# Patient Record
Sex: Male | Born: 1995 | Race: White | Hispanic: No | Marital: Single | Smoking: Never smoker
Health system: Southern US, Community
[De-identification: ages and names within clinical notes are randomized; demographics above are authoritative.]

---
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
## 2016-01-06 ENCOUNTER — Ambulatory Visit (INDEPENDENT_AMBULATORY_CARE_PROVIDER_SITE_OTHER): Payer: BLUE CROSS/BLUE SHIELD

## 2016-01-06 ENCOUNTER — Ambulatory Visit (INDEPENDENT_AMBULATORY_CARE_PROVIDER_SITE_OTHER): Payer: BLUE CROSS/BLUE SHIELD | Admitting: Family Medicine

## 2016-01-06 ENCOUNTER — Encounter: Payer: Self-pay | Admitting: Family Medicine

## 2016-01-06 VITALS — BP 107/64 | HR 78 | Temp 98.5°F | Ht 69.0 in | Wt 144.0 lb

## 2016-01-06 DIAGNOSIS — M25511 Pain in right shoulder: Secondary | ICD-10-CM

## 2016-01-06 MED ORDER — DICLOFENAC SODIUM 75 MG PO TBEC: 75.0000 mg | DELAYED_RELEASE_TABLET | Freq: Two times a day (BID) | ORAL | 0 refills | Status: AC

## 2016-01-06 NOTE — Progress Notes (Signed)
Subjective:  Patient ID: Dale Bush, male    DOB: [birth year]  Age: 21 y.o. MRN: [id number]  CC: Shoulder Pain (pt here today c/o pain in the right shoulder x 1 month, no recent injuries)   HPI Dale Bush presents for Patient is in college. He does carry a backpack over his right shoulder frequently. It is heavy with all the books that he has to carry. There is no known injury. Pain is moderate and does not prevent activities. However throwing motion for sports has been painful and diminished.   History Dale Bush has no past medical history on file.   He has no past surgical history on file.   His family history is not on file.He reports that he has never smoked. He has never used smokeless tobacco. He reports that he does not drink alcohol or use drugs.    ROS Review of Systems  Constitutional: Negative for chills, diaphoresis and fever.  HENT: Negative for rhinorrhea and sore throat.   Respiratory: Negative for cough and shortness of breath.   Cardiovascular: Negative for chest pain.  Gastrointestinal: Negative for abdominal pain.  Musculoskeletal: Negative for arthralgias and myalgias.  Skin: Negative for rash.  Neurological: Negative for weakness and headaches.    Objective:  BP 107/64   Pulse 78   Temp 98.5 F (36.9 C) (Oral)   Ht 5\' 9"  (1.753 m)   Wt 144 lb (65.3 kg)   BMI 21.27 kg/m   BP Readings from Last 3 Encounters:  01/06/16 107/64  05/02/14 112/65    Wt Readings from Last 3 Encounters:  01/06/16 144 lb (65.3 kg)  05/02/14 130 lb (59 kg) (17 %, Z= -0.97)*   * Growth percentiles are based on CDC 2-20 Years data.     Physical Exam  Constitutional: He is oriented to person, place, and time. He appears well-developed and well-nourished. He appears distressed.  HENT:  Head: Normocephalic.  Eyes: Pupils are equal, round, and reactive to light.  Neck: Normal range of motion.  Cardiovascular: Normal rate, regular rhythm and normal heart  sounds.   No murmur heard. Pulmonary/Chest: Effort normal and breath sounds normal.  Abdominal: There is no tenderness.  Musculoskeletal: He exhibits tenderness (right deltoid region over the ball of the shoulder and at the [facility]).  Neurological: He is alert and oriented to person, place, and time. He has normal reflexes.  Skin: Skin is warm and dry.  Psychiatric: His behavior is normal. Thought content normal.  Vitals reviewed.    No results found for: WBC, HGB, HCT, PLT, GLUCOSE, CHOL, TRIG, HDL, LDLDIRECT, LDLCALC, ALT, AST, NA, K, CL, CREATININE, BUN, CO2, TSH, PSA, INR, GLUF, HGBA1C, MICROALBUR  Patient was never admitted.  Assessment & Plan:   Dale Bush was seen today for shoulder pain.  Diagnoses and all orders for this visit:  Pain in joint of right shoulder -     DG Shoulder Right; Future -     Ambulatory referral to Physical Therapy  Other orders -     diclofenac (VOLTAREN) 75 MG EC tablet; Take 1 tablet (75 mg total) by mouth 2 (two) times daily. For muscle and  Joint pain    I have discontinued Dale Bush's CLARAVIS and diclofenac. I am also having him start on diclofenac.  Meds ordered this encounter  Medications  . diclofenac (VOLTAREN) 75 MG EC tablet    Sig: Take 1 tablet (75 mg total) by mouth 2 (two) times daily. For muscle and  Joint pain  Dispense:  60 tablet    Refill:  0     Follow-up: Return in about 6 weeks (around 02/17/2016), or if symptoms worsen or fail to improve.  Mechele Claude, M.D.

## 2016-01-07 ENCOUNTER — Encounter: Payer: Self-pay | Admitting: Family Medicine

## 2016-01-08 ENCOUNTER — Telehealth: Payer: Self-pay | Admitting: Family Medicine

## 2016-01-08 ENCOUNTER — Encounter: Payer: Self-pay | Admitting: Family Medicine

## 2016-01-08 ENCOUNTER — Ambulatory Visit: Payer: BLUE CROSS/BLUE SHIELD | Admitting: Family Medicine

## 2016-01-08 NOTE — Telephone Encounter (Signed)
Mom wanted to make sure address was correct in the system. Address is correct.

## 2016-01-12 ENCOUNTER — Ambulatory Visit: Payer: BLUE CROSS/BLUE SHIELD | Admitting: Family Medicine

## 2016-01-14 ENCOUNTER — Encounter: Payer: Self-pay | Admitting: Family Medicine

## 2016-03-24 ENCOUNTER — Encounter: Payer: Self-pay | Admitting: Family Medicine

## 2016-03-28 ENCOUNTER — Encounter: Payer: Self-pay | Admitting: Family Medicine

## 2016-04-14 ENCOUNTER — Encounter: Payer: Self-pay | Admitting: Family Medicine

## 2017-01-18 ENCOUNTER — Telehealth: Payer: Self-pay | Admitting: Family Medicine

## 2017-01-18 NOTE — Telephone Encounter (Signed)
Notified patients mother that we don't have that info here and he is welcome to come here to have labwork done to check or he can check with [facility]. Mother verbalized understanding.

## 2017-05-13 IMAGING — DX DG SHOULDER 2+V*R*
3 series · 3 of 3 positions shown · non-contrast
Comparison: None.

CLINICAL DATA: Right shoulder pain for 2 months without known
injury.

EXAM:
RIGHT SHOULDER - 2+ VIEW

[shoulder ap]
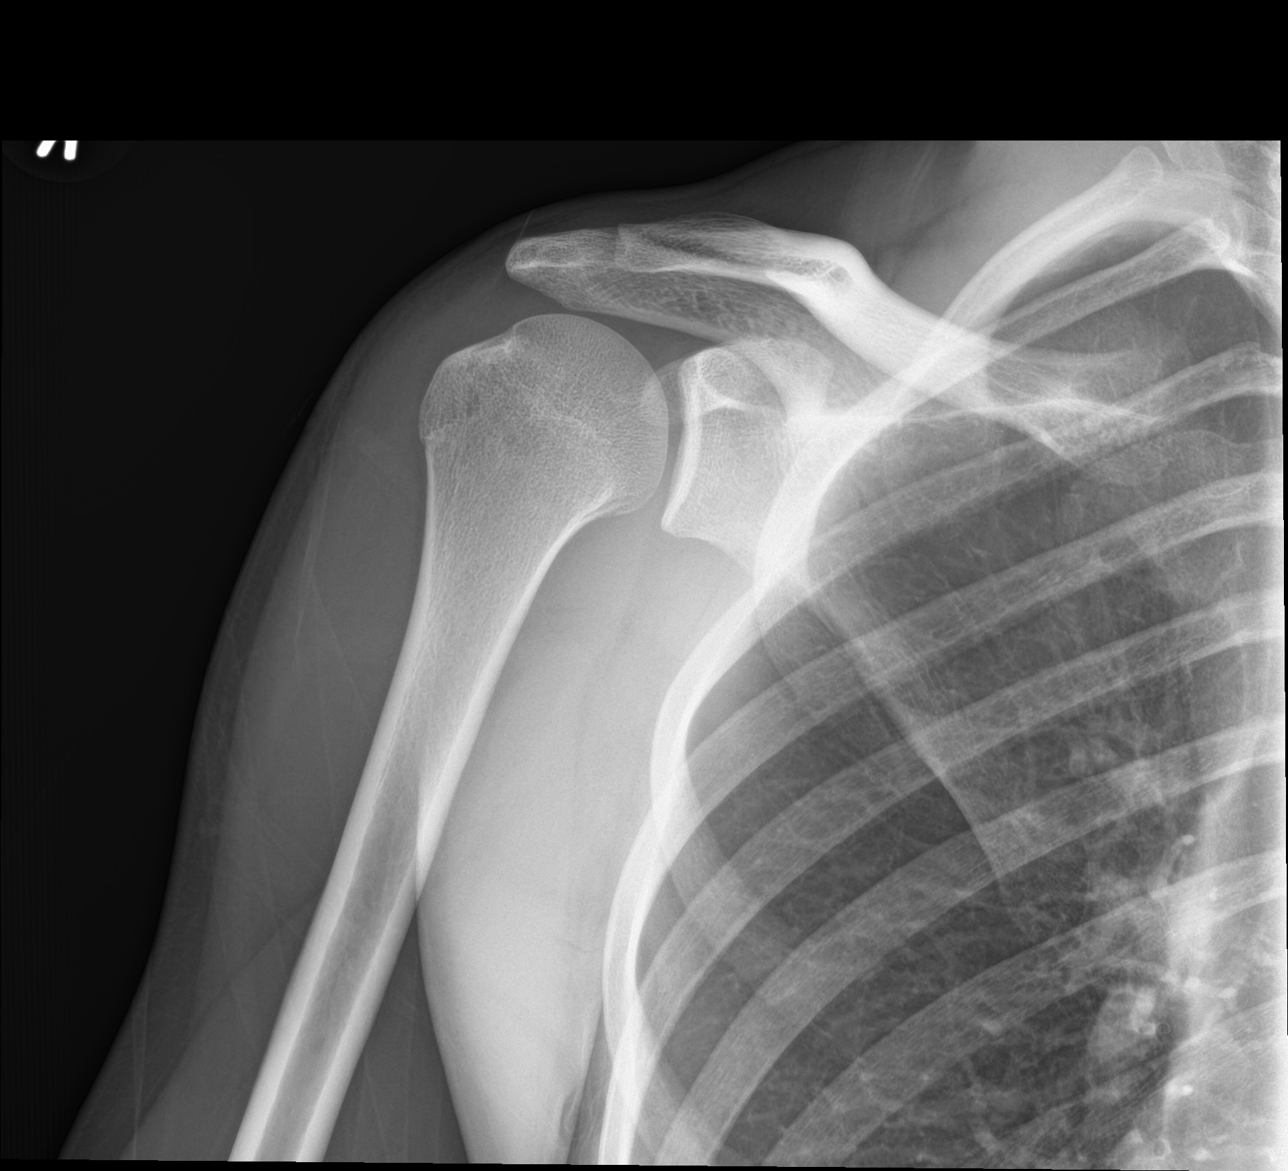

[shoulder obl]
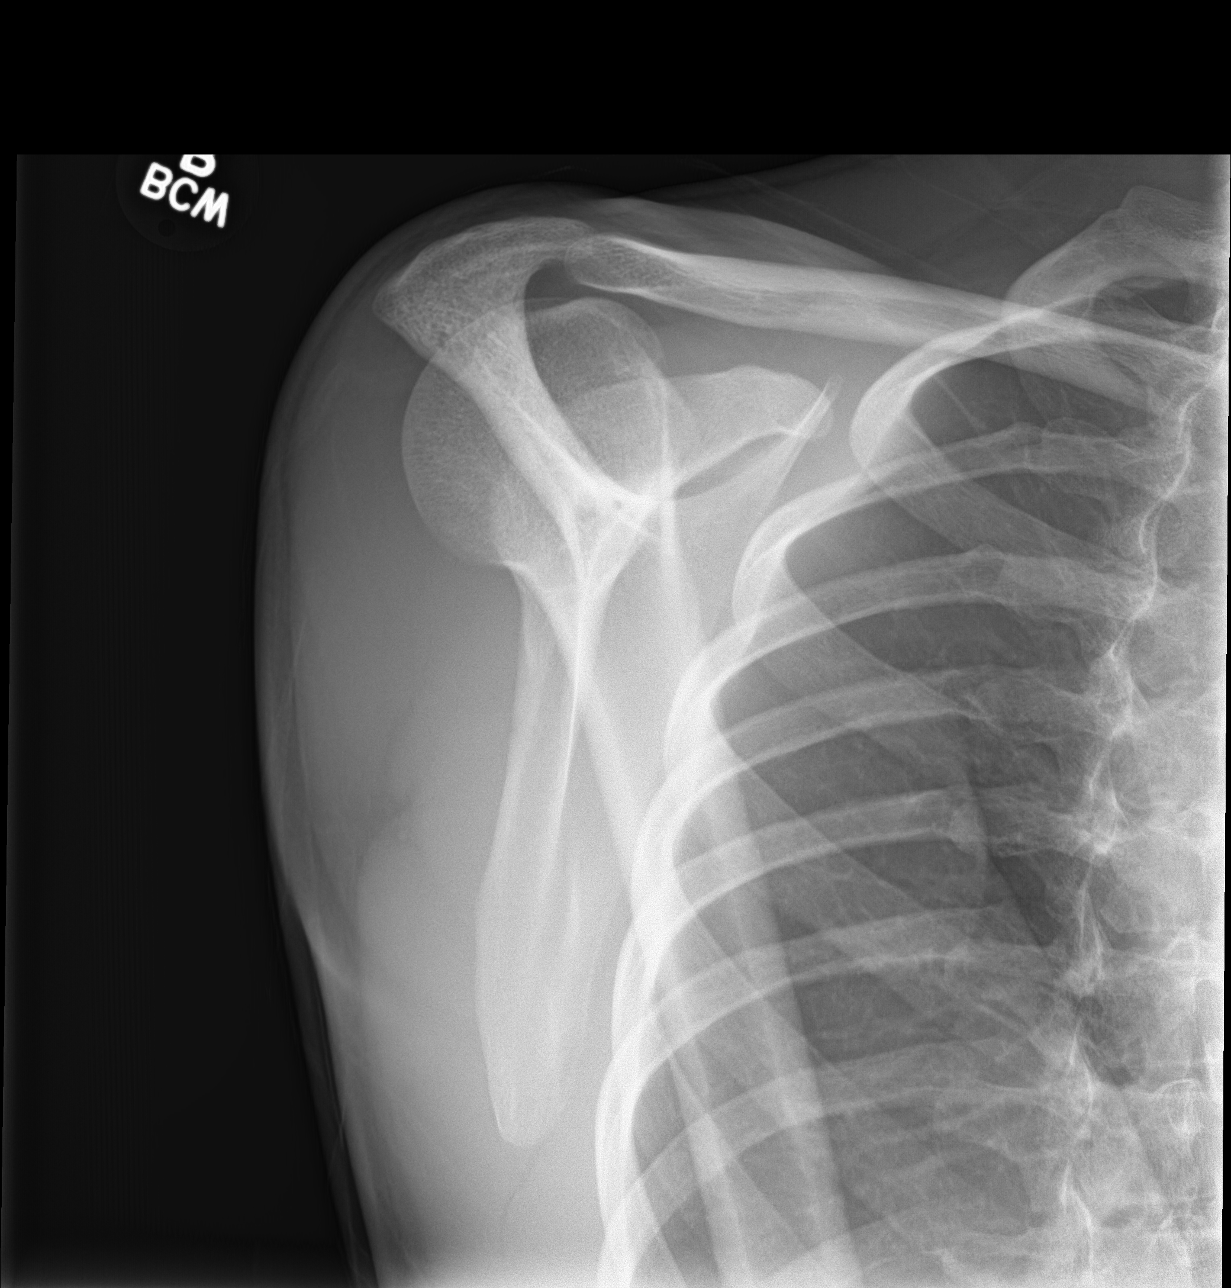

[shoulder axial]
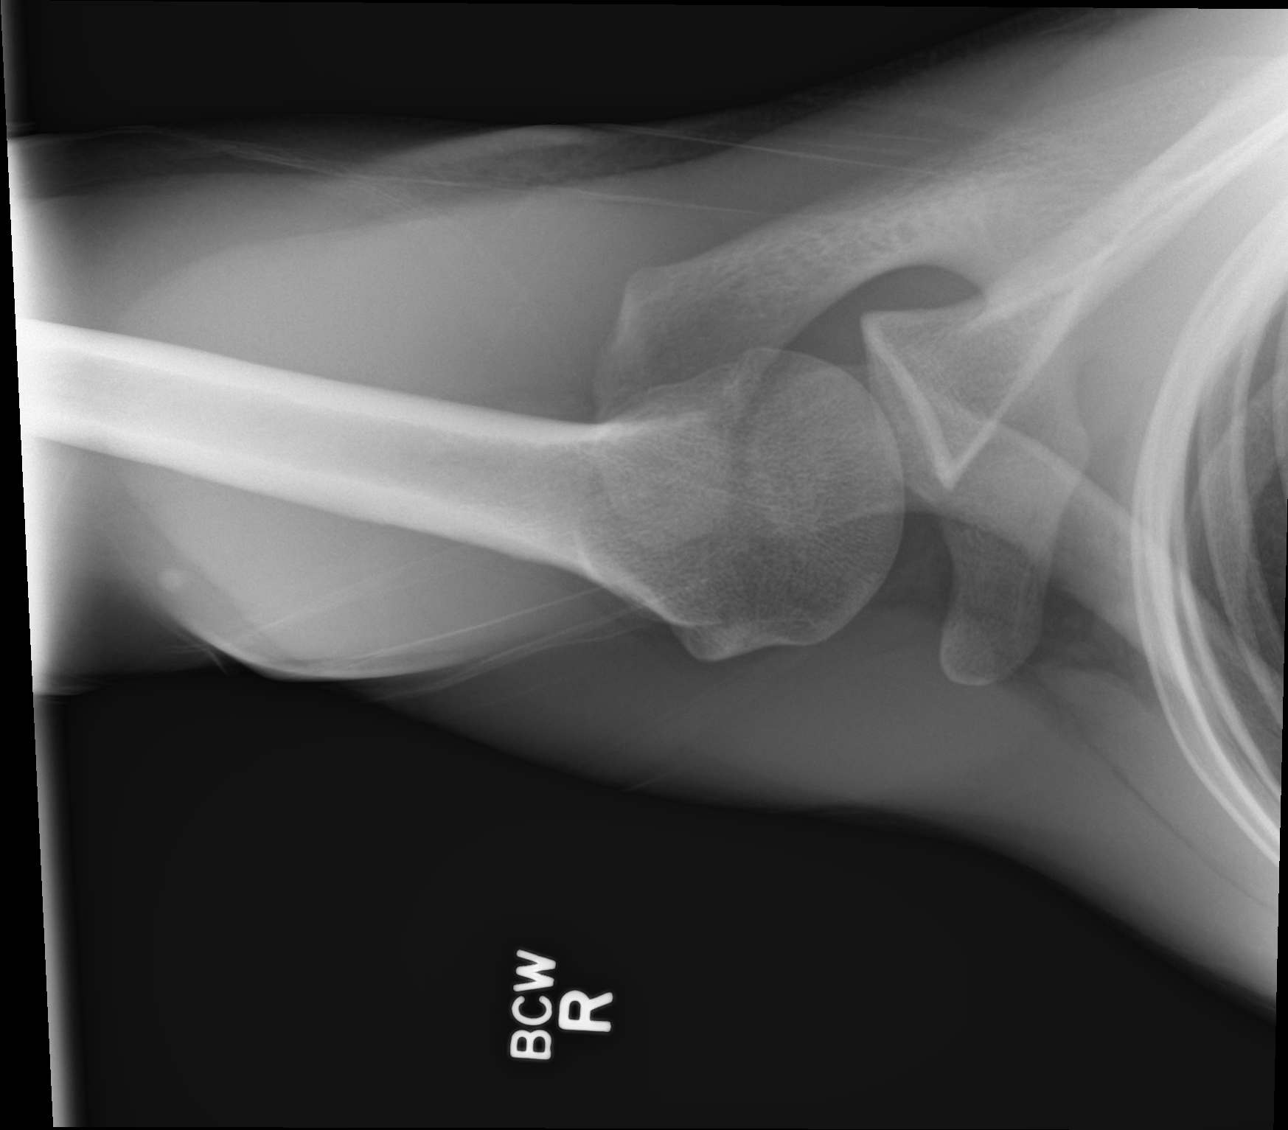

[3 of 3 positions shown; findings below may reference images not displayed]

FINDINGS: There is no evidence of fracture or dislocation. There is no
evidence of arthropathy or other focal bone abnormality. Soft
tissues are unremarkable.
IMPRESSION: Normal right shoulder.

## 2018-07-24 ENCOUNTER — Encounter: Payer: Self-pay | Admitting: Family Medicine

## 2018-08-20 ENCOUNTER — Encounter: Payer: Self-pay | Admitting: Family Medicine
